# Patient Record
Sex: Female | Born: 1960 | Race: White | Hispanic: No | Smoking: Never smoker
Health system: Southern US, Community
[De-identification: ages and names within clinical notes are randomized; demographics above are authoritative.]

## PROBLEM LIST (undated history)

## (undated) DIAGNOSIS — S134XXA Sprain of ligaments of cervical spine, initial encounter: Secondary | ICD-10-CM

---

## 1998-03-21 ENCOUNTER — Other Ambulatory Visit: Admission: RE | Admit: 1998-03-21 | Discharge: 1998-03-21 | Payer: Self-pay | Admitting: Obstetrics and Gynecology

## 1998-06-06 ENCOUNTER — Other Ambulatory Visit: Admission: RE | Admit: 1998-06-06 | Discharge: 1998-06-06 | Payer: Self-pay | Admitting: Obstetrics and Gynecology

## 1998-08-15 ENCOUNTER — Ambulatory Visit (HOSPITAL_BASED_OUTPATIENT_CLINIC_OR_DEPARTMENT_OTHER): Admission: RE | Admit: 1998-08-15 | Discharge: 1998-08-15 | Payer: Self-pay

## 1998-11-12 ENCOUNTER — Other Ambulatory Visit: Admission: RE | Admit: 1998-11-12 | Discharge: 1998-11-12 | Payer: Self-pay | Admitting: Obstetrics and Gynecology

## 1999-11-05 ENCOUNTER — Ambulatory Visit (HOSPITAL_COMMUNITY): Admission: RE | Admit: 1999-11-05 | Discharge: 1999-11-05 | Payer: Self-pay | Admitting: Pulmonary Disease

## 1999-11-05 ENCOUNTER — Encounter: Payer: Self-pay | Admitting: Pulmonary Disease

## 1999-12-01 ENCOUNTER — Ambulatory Visit (HOSPITAL_COMMUNITY): Admission: RE | Admit: 1999-12-01 | Discharge: 1999-12-01 | Payer: Self-pay | Admitting: Critical Care Medicine

## 1999-12-01 ENCOUNTER — Encounter: Payer: Self-pay | Admitting: Critical Care Medicine

## 2000-03-10 ENCOUNTER — Encounter: Payer: Self-pay | Admitting: Critical Care Medicine

## 2000-03-10 ENCOUNTER — Ambulatory Visit (HOSPITAL_COMMUNITY): Admission: RE | Admit: 2000-03-10 | Discharge: 2000-03-10 | Payer: Self-pay | Admitting: Critical Care Medicine

## 2000-03-24 ENCOUNTER — Inpatient Hospital Stay (HOSPITAL_COMMUNITY): Admission: EM | Admit: 2000-03-24 | Discharge: 2000-03-27 | Payer: Self-pay | Admitting: Critical Care Medicine

## 2000-03-24 ENCOUNTER — Encounter: Payer: Self-pay | Admitting: Critical Care Medicine

## 2000-03-25 ENCOUNTER — Encounter: Payer: Self-pay | Admitting: Critical Care Medicine

## 2000-03-26 ENCOUNTER — Encounter: Payer: Self-pay | Admitting: Critical Care Medicine

## 2000-03-27 ENCOUNTER — Encounter: Payer: Self-pay | Admitting: Critical Care Medicine

## 2000-06-07 ENCOUNTER — Ambulatory Visit: Admission: RE | Admit: 2000-06-07 | Discharge: 2000-06-07 | Payer: Self-pay | Admitting: Critical Care Medicine

## 2000-06-07 ENCOUNTER — Encounter: Payer: Self-pay | Admitting: Critical Care Medicine

## 2008-09-29 ENCOUNTER — Inpatient Hospital Stay (HOSPITAL_COMMUNITY): Admission: EM | Admit: 2008-09-29 | Discharge: 2008-10-05 | Payer: Self-pay | Admitting: Emergency Medicine

## 2008-10-04 ENCOUNTER — Encounter: Payer: Self-pay | Admitting: Obstetrics & Gynecology

## 2010-02-11 ENCOUNTER — Encounter: Payer: Self-pay | Admitting: Gastroenterology

## 2010-05-11 IMAGING — CT CT ABDOMEN W/ CM
2 of 5 series · 16 of 46 positions shown, 18 images · IV contrast (APPLIED)
Comparison: Plain film chest earlier today.

CT ABDOMEN

Addendum Begins

Further clinical information now brought to my attention.  The
patient has no risk factors for hydrosalpinx or pelvic inflammatory
disease.  Therefore, the cylindrical fluid collection in the left
hemi pelvis could represent an abscess from the adjacent sigmoid
colon.  However, the adjacent colon is not significantly inflamed.
I discussed this case with the scarring service.  The patient will
receive a pelvic ultrasound for further evaluation.  Depending on
the outcome of this study, aspiration may be necessary to evaluate
for possible diverticular abscess.
Addendum Ends
CLINICAL DATA: Abdominal pain.  Nausea.
CT ABDOMEN AND PELVIS WITH CONTRAST
TECHNIQUE: Multidetector CT imaging of the abdomen and pelvis was
performed following the standard protocol following the bolus
administration of intravenous contrast.
Contrast: 100 ml [id number]

[Series 2: abd_pel 5.0 b40f st · axial · 0.68mm/px · z∈[-494,-78]mm · 13 of 93 slices shown, 15 images]
[im 5/93  soft-tissue]
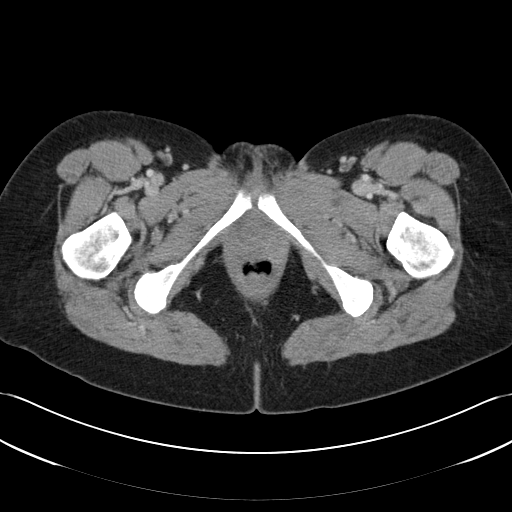
[im 5/93  bone]
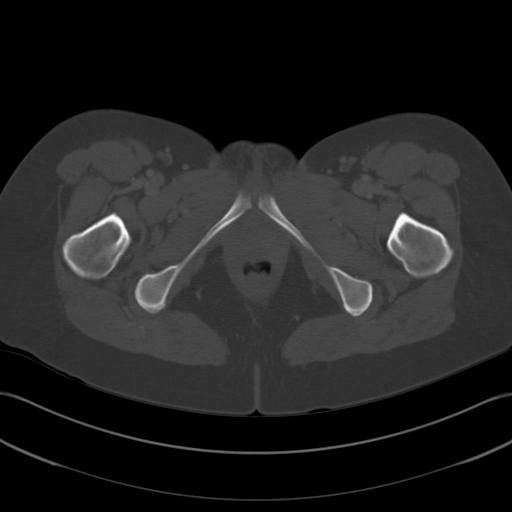
[im 15/93  soft-tissue]
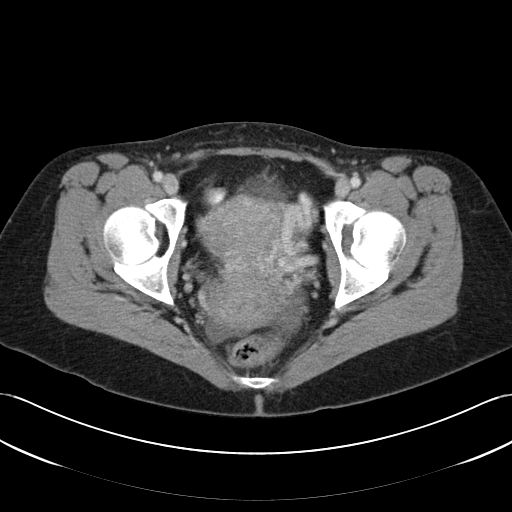
[im 20/93  soft-tissue]
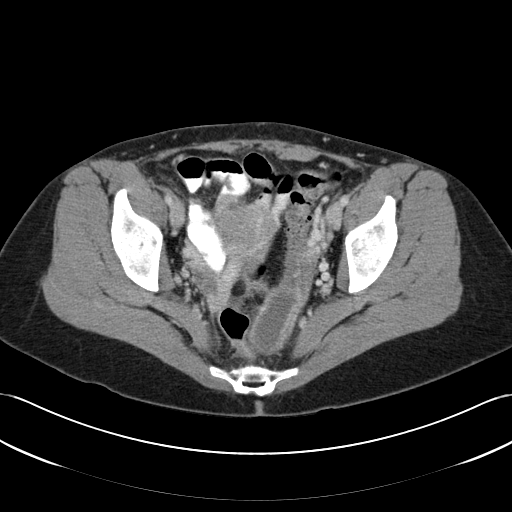
[im 25/93  soft-tissue]
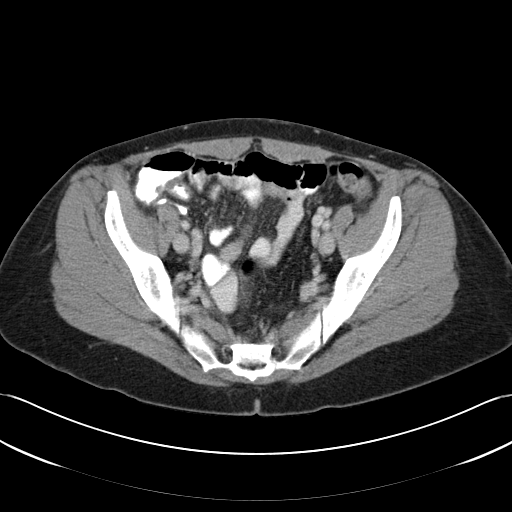
[im 34/93  soft-tissue]
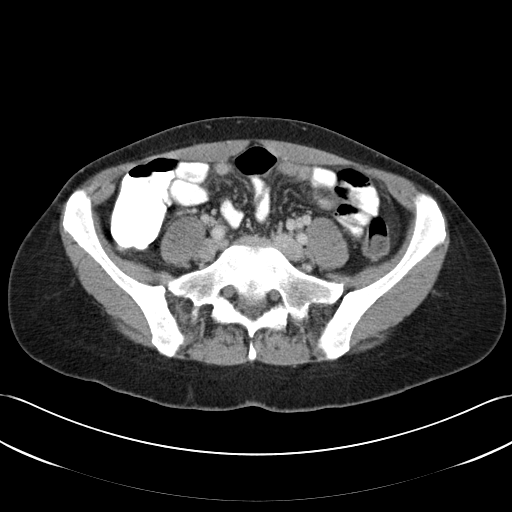
[im 39/93  soft-tissue]
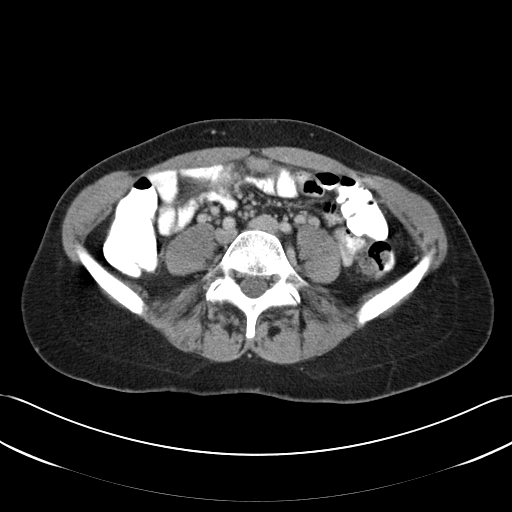
[im 49/93  soft-tissue]
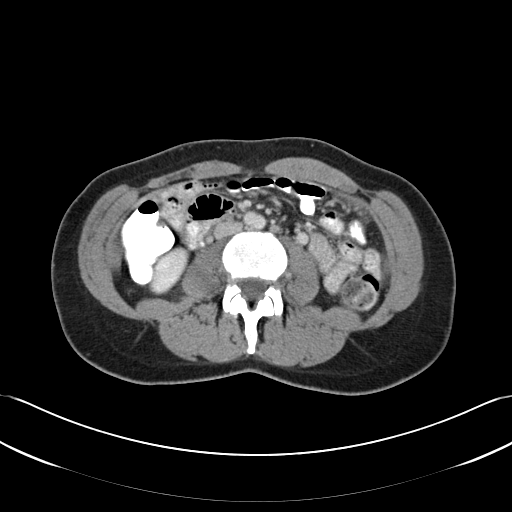
[im 54/93  soft-tissue]
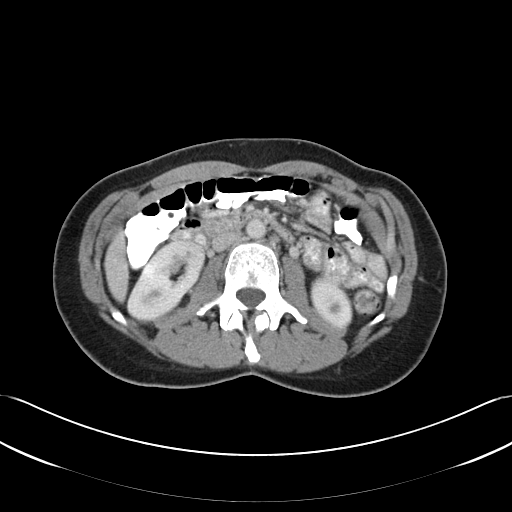
[im 59/93  soft-tissue]
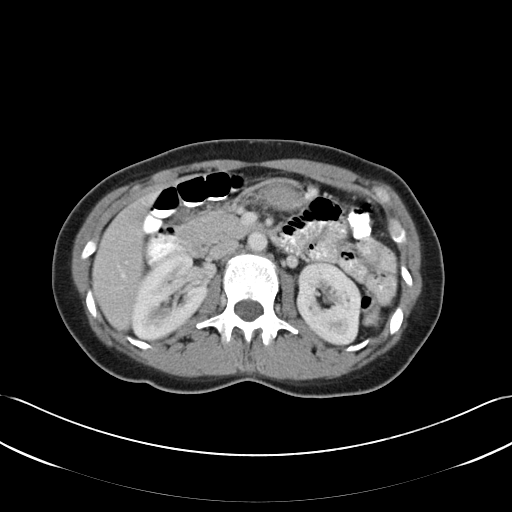
[im 59/93  bone]
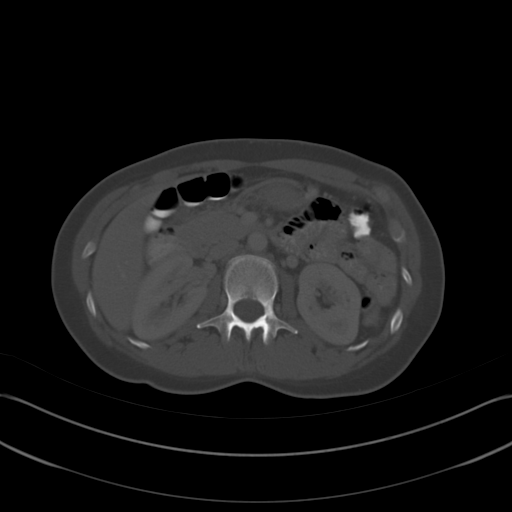
[im 68/93  soft-tissue]
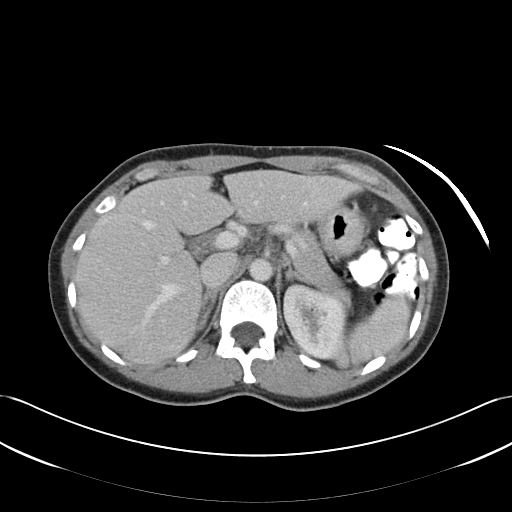
[im 73/93  soft-tissue]
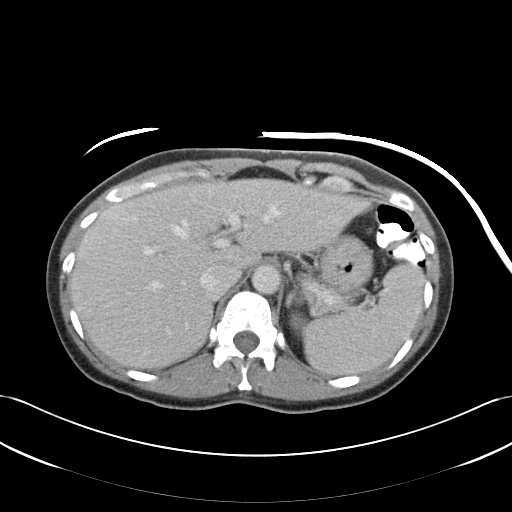
[im 78/93  soft-tissue]
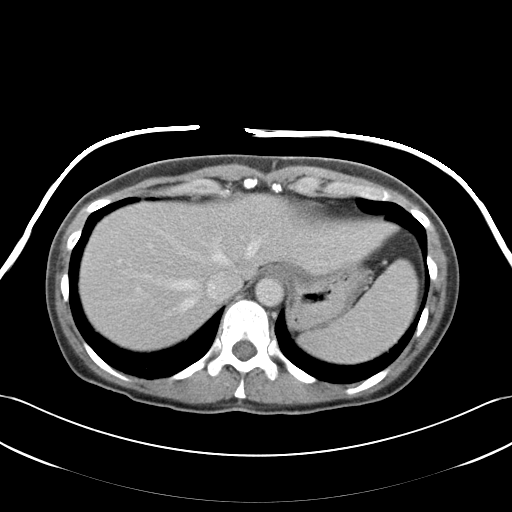
[im 88/93  soft-tissue]
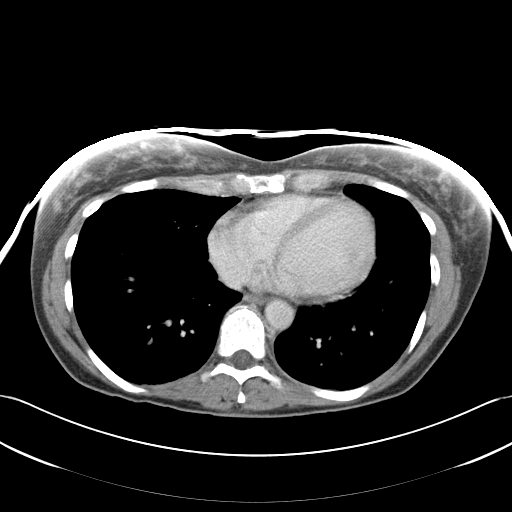

[Series 602: coronal · coronal · 0.94mm/px · 3 of 55 slices shown]
[im 19/55  soft-tissue]
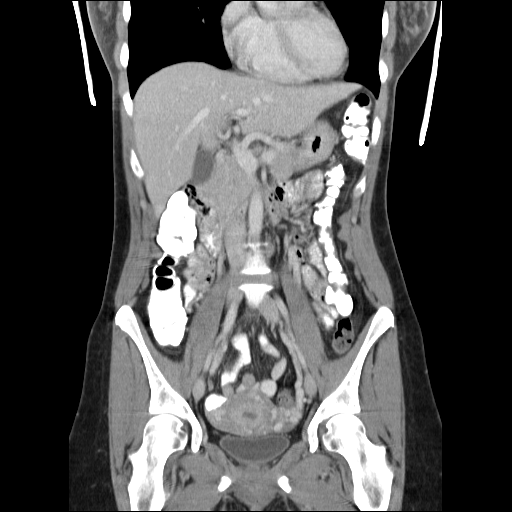
[im 25/55  soft-tissue]
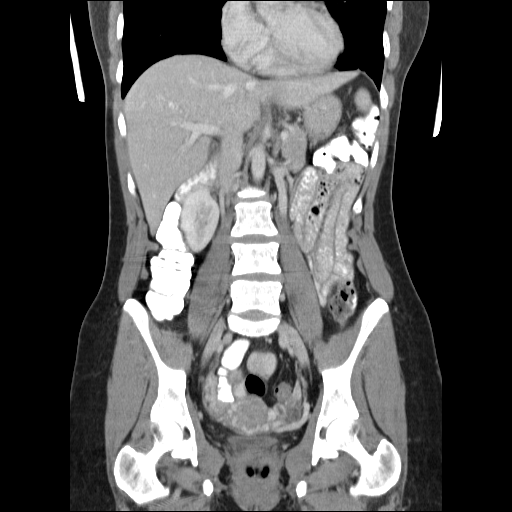
[im 31/55  soft-tissue]
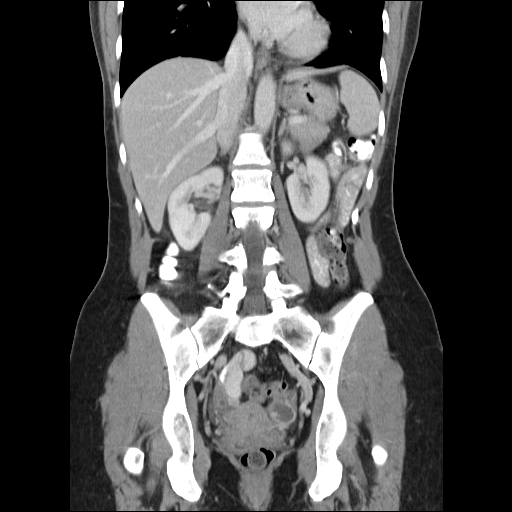

[16 of 46 positions shown; findings below may reference images not displayed]

FINDINGS: Mild bibasilar atelectasis. Normal heart size without
pericardial or pleural effusion.  Normal liver, spleen, pancreas.
The stomach appears thick-walled.  This is likely due to
underdistension.

Gallbladder phrygian cap.  No acute cholecystitis or biliary ductal
dilatation.

Normal adrenal glands and right kidney.  Lower pole left renal
cyst.

Prominent left greater than right gonadal veins as can be seen with
pelvic congestion syndrome. No retroperitoneal or retrocrural
adenopathy. Normal colon, appendix, and terminal ileum.

Normal abdominal small bowel without ascites.
IMPRESSION: 1. No acute abdominal process.
2.  Apparent gastric wall thickening felt to be due to
underdistension.  Gastritis felt less likely.

CT PELVIS
FINDINGS: Colonic diverticulosis.  Normal pelvic small bowel.  No
pelvic adenopathy.    Normal urinary bladder and uterus. Trace free
pelvic fluid is likely physiologic.  Normal right adnexa.  A
cylindrical fluid density structure with peripheral
hyperenhancement measures 6.8 x 2.8 cm on image 76. No acute
osseous abnormality.
IMPRESSION: 1.  A cylindrical fluid density structure in the left hemi pelvis
is suspicious for hydrosalpinx.  No specific signs to suggest
pyosalpinx.  Correlate with signs/symptoms of pelvic inflammatory
disease.

2.  Prominent gonadal veins as can be seen with pelvic congestion
syndrome.
3. Trace free pelvic fluid is likely physiologic.

## 2010-05-11 IMAGING — CR DG CHEST 1V PORT
1 series · 1 of 1 positions shown · non-contrast
Comparison: None

CLINICAL DATA: Abdominal pain

CHEST - 1 VIEW

[view not recorded]
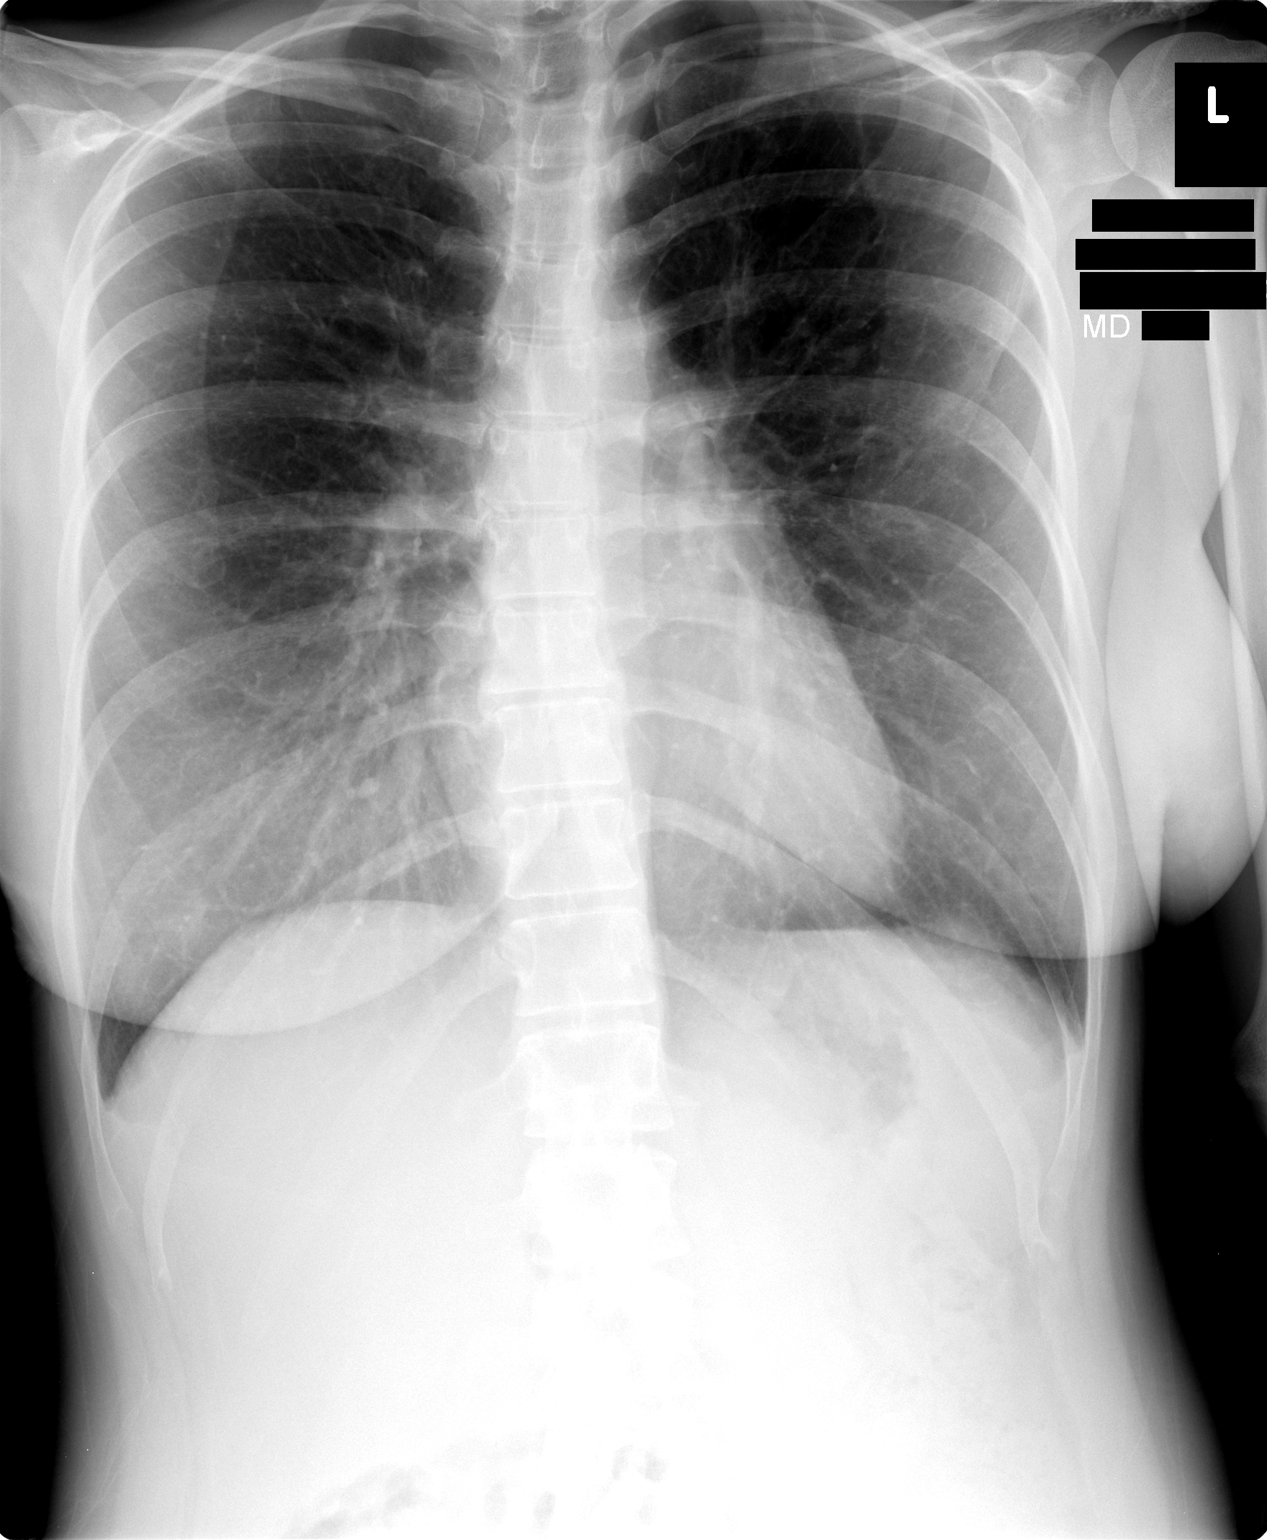

[1 of 1 positions shown; findings below may reference images not displayed]

FINDINGS: The heart size and mediastinal contours are within normal
limits.  Both lungs are clear.
IMPRESSION: No active disease.

## 2010-05-14 IMAGING — US US TRANSVAGINAL NON-OB
1 series · 13 of 25 positions shown · non-contrast
Comparison: At abdomen pelvis CTs 09/29/2008 and pelvic ultrasound
09/29/2008.

CLINICAL DATA: Left lower quadrant/left pelvic pain. Follow-up
abnormalities seen in left adnexa.

TRANSVAGINAL ULTRASOUND OF PELVIS
TECHNIQUE: Transvaginal ultrasound examination of the pelvis was
performed including evaluation of the uterus, ovaries, adnexal
regions, and pelvic cul-de-sac.

[Series 1: us transvaginal non-ob · 0.13mm/px · 13 of 55 slices shown]
[im 1/55]
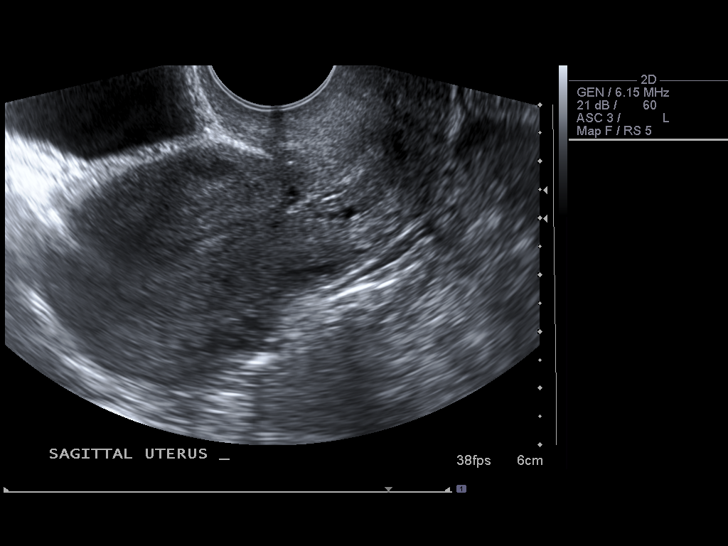
[im 5/55]
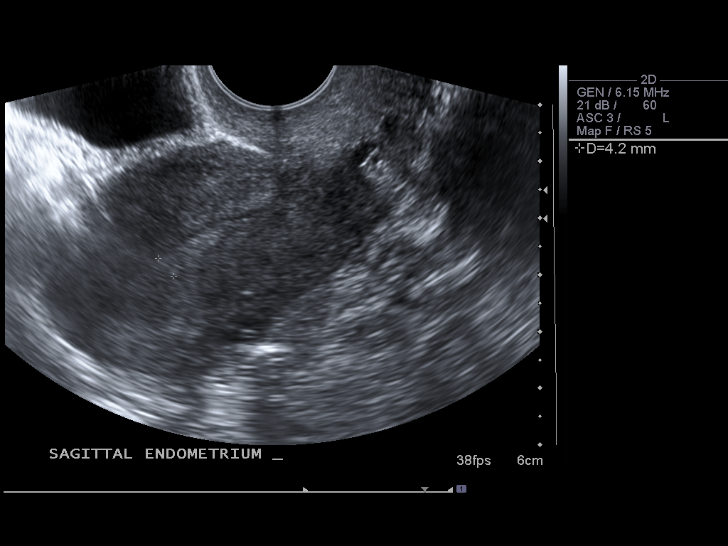
[im 10/55]
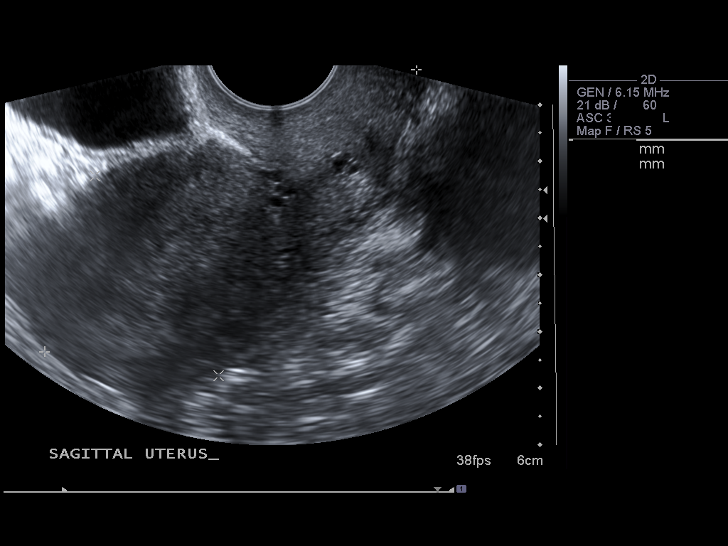
[im 14/55]
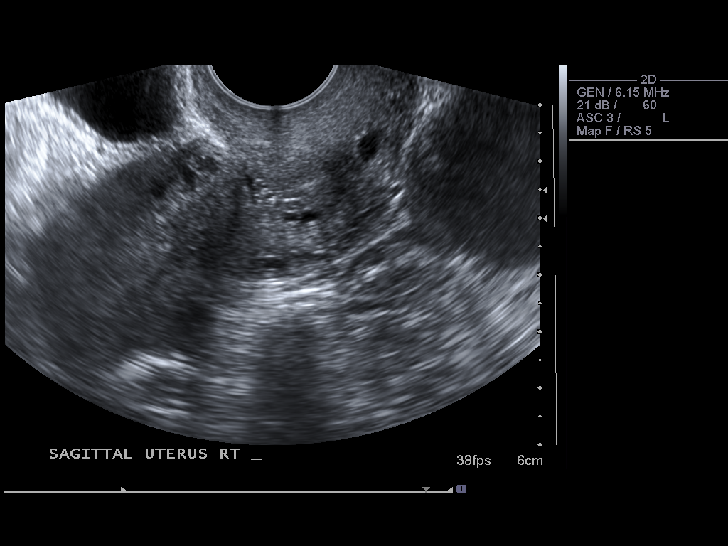
[im 19/55]
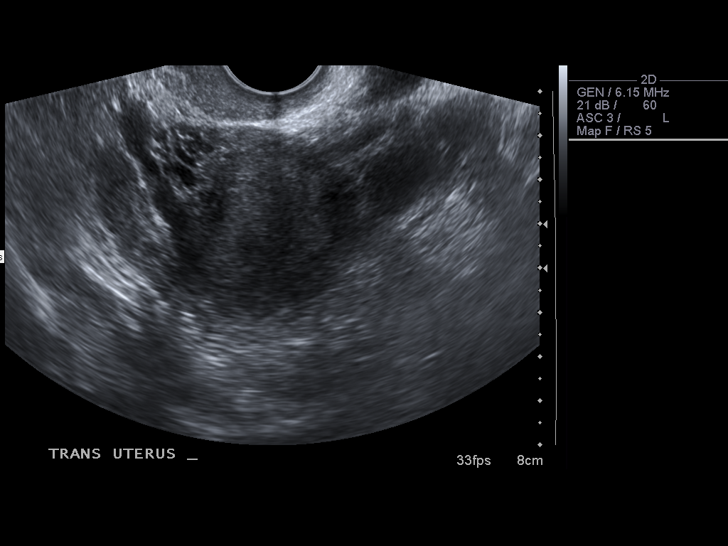
[im 23/55]
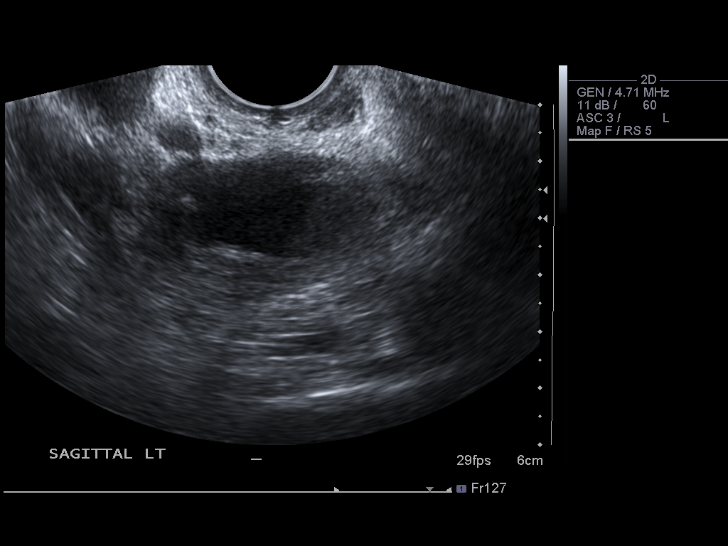
[im 28/55]
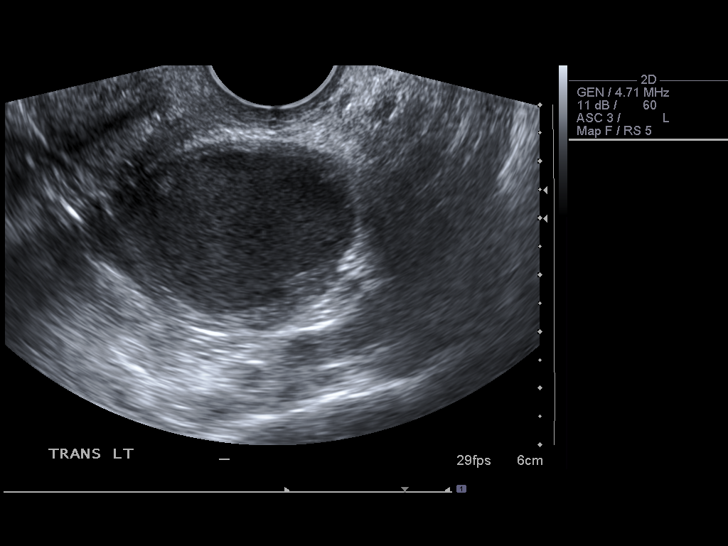
[im 32/55]
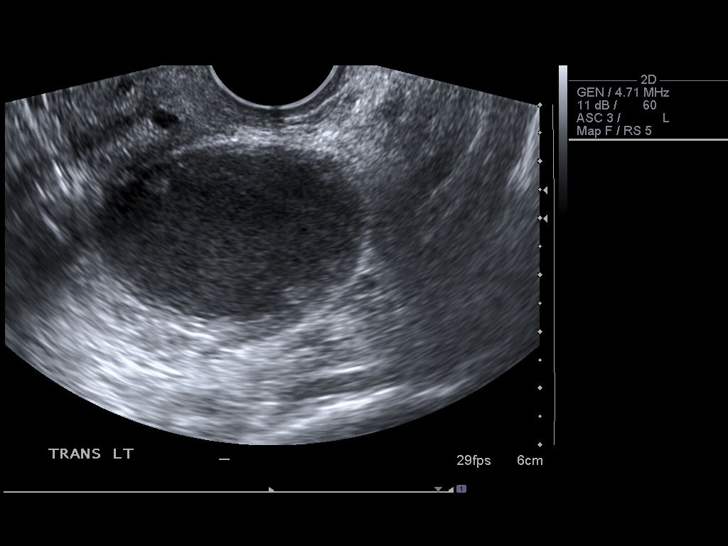
[im 37/55]
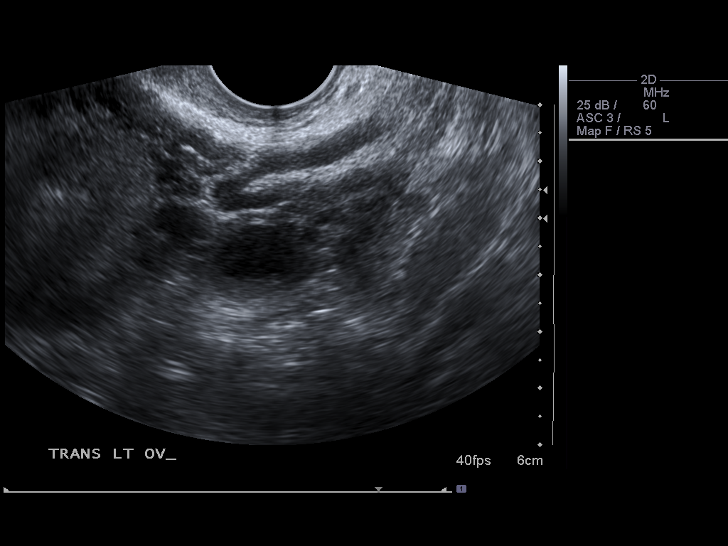
[im 41/55]
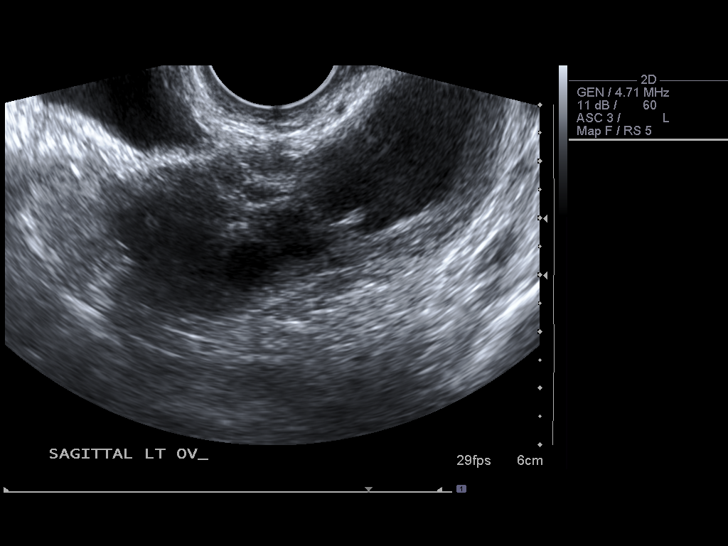
[im 46/55]
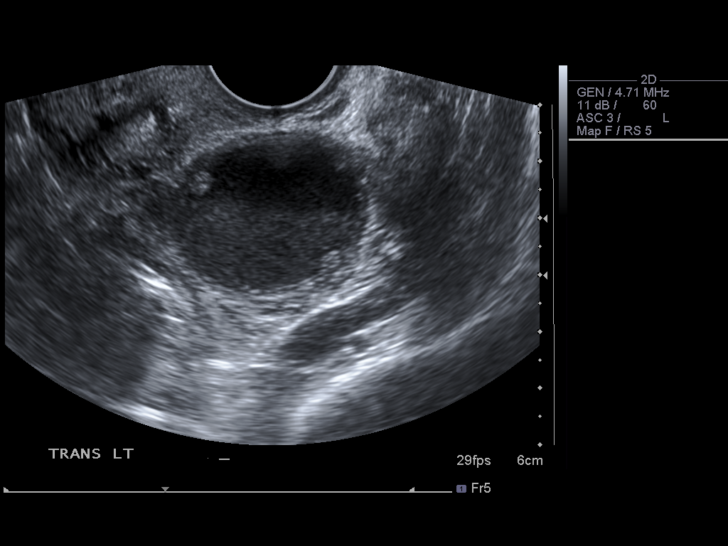
[im 50/55]
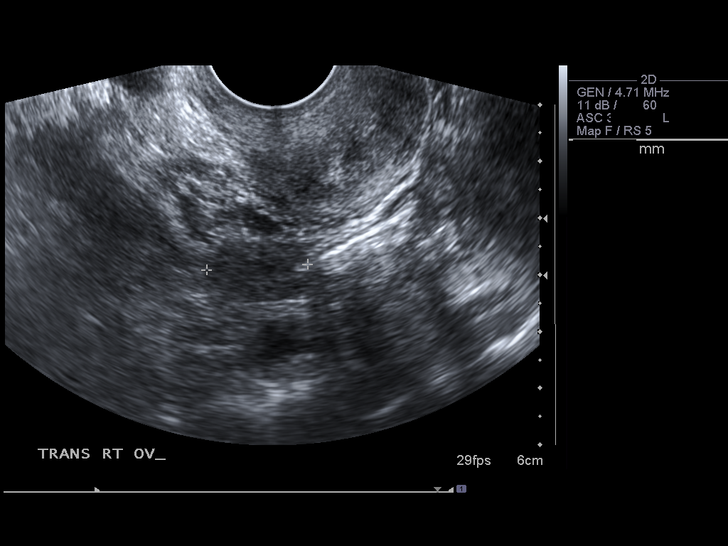
[im 55/55]
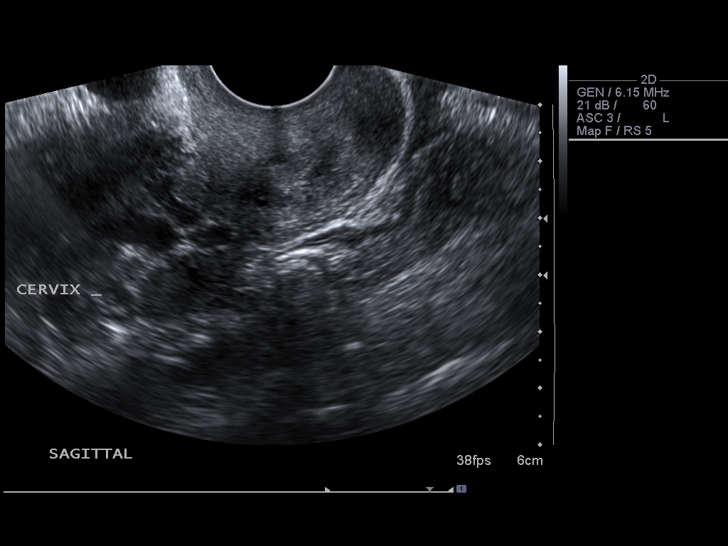

[13 of 25 positions shown; findings below may reference images not displayed]

FINDINGS: Uterus:  The uterus is 8.0 cm in length by 4.0 cm AP diameter by
4.1 cm in width. The endometrium is 4 mm in thickness.

In the left adnexa is a dilated tubular structure which on today's
examination demonstrates a fluid/ debris level internally.  There
are several focal echogenic areas of nodular thickening along the
wall of this tubular structure, which have appearances suggestive
of thickened endosalpingeal folds of a fallopian tube.  This
tubular structure measures approximately 7.6 x 3.1 x 4.8 cm.  Some
vascular flow is identified within the wall of the tubular
structure, but none is seen internally.  The adjacent left ovary is
estimated to measure 2.9 x 1.4 x 1.8 cm and does contain some
internal anechoic areas which may be follicles.

The right ovary measures 2.6 x 1.3 x 1.8 cm and has  normal
sonographic appearances.

No discrete free pelvic fluid is identified.
IMPRESSION: Persistent tubular cystic mass in the left adnexa measures 7.6 cm
greatest diameter.  Given the presence of an internal fluid/debris
level and nodular echogenic foci along the wall (suggestive of
thickened endosalpingeal folds), the sonographic appearances are
most suggestive of pyosalpinx related to pelvic inflammatory
disease. A pelvic abscess secondary to other causes such as
diverticular disease is a consideration, but felt to be less
likely.  Ovarian neoplasm cannot be entirely excluded and follow-up
imaging to ensure resolution is recommended.

## 2010-05-20 NOTE — Letter (Signed)
Summary: Colonoscopy Date Change Letter  [facility]  [address] [place], [place] [address]   Phone: [phone]  Fax: [phone]      February 11, 2010 MRN: [id number]   [facility] [address] [place], [place]  [address]   Dear Ms. Rissmiller,   Previously you were recommended to have a repeat colonoscopy around this time. Your chart was recently reviewed by Dr.Tadarrius Burch of [facility]. Follow up colonoscopy is now recommended in 02-2013. This revised recommendation is based on current, nationally recognized guidelines for colorectal cancer screening and polyp surveillance. These guidelines are endorsed by the American Cancer Society, [employer] on Colorectal Cancer as well as numerous other major medical organizations.  Please understand that our recommendation assumes that you do not have any new symptoms such as bleeding, a change in bowel habits, anemia, or significant abdominal discomfort. If you do have any concerning GI symptoms or want to discuss the guideline recommendations, please call to arrange an office visit at your earliest convenience. Otherwise we will keep you in our reminder system and contact you 1-2 months prior to the date listed above to schedule your next colonoscopy.  Thank you,  Barbette Hair. Arlyce Dice, M.D.  [facility] [phone]

## 2010-07-28 LAB — DIFFERENTIAL
Basophils Absolute: 0 10*3/uL (ref 0.0–0.1)
Basophils Absolute: 0.2 10*3/uL — ABNORMAL HIGH (ref 0.0–0.1)
Basophils Relative: 1 % (ref 0–1)
Basophils Relative: 1 % (ref 0–1)
Basophils Relative: 0 % (ref 0–1)
Basophils Relative: 0 % (ref 0–1)
Basophils Relative: 1 % (ref 0–1)
Basophils Relative: 1 % (ref 0–1)
Eosinophils Absolute: 0.1 10*3/uL (ref 0.0–0.7)
Eosinophils Absolute: 0.2 10*3/uL (ref 0.0–0.7)
Eosinophils Absolute: 0.1 10*3/uL (ref 0.0–0.7)
Eosinophils Absolute: 0.2 10*3/uL (ref 0.0–0.7)
Eosinophils Absolute: 0.2 10*3/uL (ref 0.0–0.7)
Eosinophils Relative: 2 % (ref 0–5)
Eosinophils Relative: 4 % (ref 0–5)
Eosinophils Relative: 2 % (ref 0–5)
Lymphocytes Relative: 12 % (ref 12–46)
Lymphs Abs: 1.1 10*3/uL (ref 0.7–4.0)
Lymphs Abs: 1.1 10*3/uL (ref 0.7–4.0)
Monocytes Absolute: 0.8 10*3/uL (ref 0.1–1.0)
Monocytes Relative: 13 % — ABNORMAL HIGH (ref 3–12)
Monocytes Relative: 16 % — ABNORMAL HIGH (ref 3–12)
Monocytes Relative: 10 % (ref 3–12)
Monocytes Relative: 10 % (ref 3–12)
Monocytes Relative: 4 % (ref 3–12)
Monocytes Relative: 4 % (ref 3–12)
Neutro Abs: 10.2 10*3/uL — ABNORMAL HIGH (ref 1.7–7.7)
Neutro Abs: 5.8 10*3/uL (ref 1.7–7.7)
Neutrophils Relative %: 74 % (ref 43–77)
Neutrophils Relative %: 75 % (ref 43–77)
Neutrophils Relative %: 86 % — ABNORMAL HIGH (ref 43–77)

## 2010-07-28 LAB — CBC
HCT: 31.3 % — ABNORMAL LOW (ref 36.0–46.0)
HCT: 37 % (ref 36.0–46.0)
Hemoglobin: 12.3 g/dL (ref 12.0–15.0)
MCHC: 33.8 g/dL (ref 30.0–36.0)
MCHC: 34.1 g/dL (ref 30.0–36.0)
MCHC: 32.9 g/dL (ref 30.0–36.0)
MCHC: 34.9 g/dL (ref 30.0–36.0)
MCHC: 35.3 g/dL (ref 30.0–36.0)
MCV: 94 fL (ref 78.0–100.0)
MCV: 94.2 fL (ref 78.0–100.0)
MCV: 94.9 fL (ref 78.0–100.0)
MCV: 95 fL (ref 78.0–100.0)
MCV: 95.2 fL (ref 78.0–100.0)
Platelets: 194 10*3/uL (ref 150–400)
Platelets: 159 10*3/uL (ref 150–400)
Platelets: 160 10*3/uL (ref 150–400)
Platelets: 195 10*3/uL (ref 150–400)
RBC: 3.33 MIL/uL — ABNORMAL LOW (ref 3.87–5.11)
RBC: 3.94 MIL/uL (ref 3.87–5.11)
RBC: 3.59 MIL/uL — ABNORMAL LOW (ref 3.87–5.11)
RBC: 3.72 MIL/uL — ABNORMAL LOW (ref 3.87–5.11)
RBC: 3.83 MIL/uL — ABNORMAL LOW (ref 3.87–5.11)
RDW: 12.3 % (ref 11.5–15.5)
RDW: 12.6 % (ref 11.5–15.5)
WBC: 6.6 10*3/uL (ref 4.0–10.5)
WBC: 7.9 10*3/uL (ref 4.0–10.5)

## 2010-07-28 LAB — CLOSTRIDIUM DIFFICILE EIA

## 2010-07-28 LAB — HEPATIC FUNCTION PANEL
ALT: 16 U/L (ref 0–35)
AST: 23 U/L (ref 0–37)
Bilirubin, Direct: 0.2 mg/dL (ref 0.0–0.3)
Total Bilirubin: 1.1 mg/dL (ref 0.3–1.2)

## 2010-07-28 LAB — CULTURE, BLOOD (ROUTINE X 2): Culture: NO GROWTH

## 2010-07-28 LAB — APTT: aPTT: 33 seconds (ref 24–37)

## 2010-07-28 LAB — BASIC METABOLIC PANEL
BUN: 5 mg/dL — ABNORMAL LOW (ref 6–23)
CO2: 27 mEq/L (ref 19–32)
Chloride: 104 mEq/L (ref 96–112)
Creatinine, Ser: 0.64 mg/dL (ref 0.4–1.2)
Potassium: 3.9 mEq/L (ref 3.5–5.1)

## 2010-07-28 LAB — POCT I-STAT, CHEM 8
BUN: 4 mg/dL — ABNORMAL LOW (ref 6–23)
Calcium, Ion: 1.13 mmol/L (ref 1.12–1.32)
Chloride: 100 meq/L (ref 96–112)
Creatinine, Ser: 0.8 mg/dL (ref 0.4–1.2)
Glucose, Bld: 94 mg/dL (ref 70–99)
HCT: 46 % (ref 36.0–46.0)
Hemoglobin: 15.6 g/dL — ABNORMAL HIGH (ref 12.0–15.0)
Potassium: 3.9 mEq/L (ref 3.5–5.1)
Sodium: 137 meq/L (ref 135–145)
TCO2: 26 mmol/L (ref 0–100)

## 2010-07-28 LAB — URINALYSIS, ROUTINE W REFLEX MICROSCOPIC
Hgb urine dipstick: NEGATIVE
Nitrite: NEGATIVE
Protein, ur: NEGATIVE mg/dL
Urobilinogen, UA: 0.2 mg/dL (ref 0.0–1.0)

## 2010-07-28 LAB — URINE MICROSCOPIC-ADD ON

## 2010-07-28 LAB — PROTIME-INR
INR: 1.1 (ref 0.00–1.49)
Prothrombin Time: 14.6 seconds (ref 11.6–15.2)

## 2010-07-28 LAB — GC/CHLAMYDIA PROBE AMP, GENITAL: Chlamydia, DNA Probe: NEGATIVE

## 2010-07-28 LAB — HCG, SERUM, QUALITATIVE: Preg, Serum: NEGATIVE

## 2010-09-02 NOTE — Consult Note (Signed)
NAMEOLEDA, BORSKI                ACCOUNT NO.:  [id number]   MEDICAL RECORD NO.:  [id number]          PATIENT TYPE:  INP   LOCATION:  1527                         FACILITY:  [facility]   PHYSICIAN:  Wilmon Arms. Corliss Skains, M.D. DATE OF BIRTH:  October 14, 1960   DATE OF CONSULTATION:  10/01/2008  DATE OF DISCHARGE:                                 CONSULTATION   CONSULTING PHYSICIAN:  M. Leda Quail, MD.   REASON FOR EVALUATION:  Fever, possible diverticular abscess.   This is a 50 year old female in good health who presents with a 3-day  history of acute onset of lower abdominal pelvic pain.  The patient has  had previous ovarian cysts which have ruptured and she states that this  pain feels similar although it did not resolve as it usually does.  She  came into the emergency department on June 12 where she underwent a  workup.  At that time her white count was noted to be elevated at 13.6,  hemoglobin 14.8.  Electrolytes within normal limits.  A CT scan was  obtained which showed no abdominal findings but in the pelvis she did  have a cylindrical fluid density structure in the left hemipelvis  suspicious for hydrosalpinx.  Dr. Hyacinth Meeker admitted the patient.  She  really has no risk factors for pelvic inflammatory disease.  She had  been on antibiotics and her pain has improved.  Her white count has  normalized but she continues to have intermittent fever.  We are asked  to see her for possible diverticulitis.  The patient has known  diverticulosis with a colonoscopy within the last 5 years by Dr. Victorino Dike.  No other findings were noted.  The patient does report that  she continues to have bowel movements and that her pain tends to worsen  when she has a bowel movement.  On the CT scan there was no sign of  colon wall thickening or surrounding inflammation.   PAST MEDICAL HISTORY:  1. Diverticulosis.  2. Skin cancer.  3. Ovarian cysts.   PAST SURGICAL HISTORY:  1. Cesarean section  2.  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  3. Sinus surgery.   MEDICATIONS:  No medications at home.  In the hospital she has been on  cefoxitin, Flagyl and doxycycline.   ALLERGIES:  NONE, ALTHOUGH SHE IS VERY SENSITIVE TO NARCOTICS.   SOCIAL HISTORY:  Nonsmoker, nondrinker.  The patient is widowed.   PHYSICAL EXAMINATION:  T-max 1016, T current 98.2, pulse 78, blood  pressure 114/71, saturations 99% on room air.  This is a well-developed,  well-nourished female in no apparent distress.  HEENT:  EOMI.  Sclerae anicteric.  NECK:  No mass, no thyromegaly.  LUNGS:  Clear.  Normal respiratory effort.  HEART:  Regular rate, rhythm.  No murmur.  ABDOMEN:  Positive bowel sounds, mildly distended, minimal generalized  tenderness but most of her tenderness is located in the left lower  quadrant.  EXTREMITIES:  No edema.  SKIN:  Warm, dry with no sign of jaundice.   LABORATORIES:  White  count 9.9, hemoglobin 12.1 CT scan as described  above.   IMPRESSION:  Abdominal pain, intermittent fever with a tubular fluid-  filled structure in the left hemipelvis.  Differential diagnosis of  hydrosalpinx versus diverticular abscess.  I am not convinced that this  represents diverticulitis as she has no thickening or inflammation of  the colon.  However, this case is a bit unusual as she has very little  risk factors for pelvic inflammatory disease.   RECOMMENDATIONS:  If the patient does not continue to improve on the  antibiotics, we would ask Radiology to repeat her CT scan and aspirate  this fluid collection.  If they obtain purulent fluid then I would ask  them to leave a drain in place.  If it is hydrosalpinx then  aspirating  it may relieve her symptoms.  I discussed this plan with Dr. Hyacinth Meeker by  telephone.      Wilmon Arms. Tsuei, M.D.  Electronically Signed     MKT/MEDQ  D:  10/01/2008  T:  10/01/2008  Job:  [id number]   cc:   M. Leda Quail, MD  Fax:  [phone]

## 2010-09-02 NOTE — Consult Note (Signed)
Megan Bauer, COVEY                ACCOUNT NO.:  [id number]   MEDICAL RECORD NO.:  [id number]          PATIENT TYPE:  EMS   LOCATION:  ED                           FACILITY:  [facility]   PHYSICIAN:  M. Leda Quail, MD  DATE OF BIRTH:  11/09/1960   DATE OF CONSULTATION:  09/30/2008  DATE OF DISCHARGE:                                 CONSULTATION   CHIEF COMPLAINT:  Abdominal pain.   HISTORY OF PRESENT ILLNESS:  The patient is a 50 year old, widowed, G2,  P1, A1 white female who presents with acute onset of abdominal pain at  approximately 11:00 yesterday. It was initially low in the pelvis and  bilateral.  It lasted for approximately an hour and a half before she  had some lessening of symptoms. She has a history of ovarian cyst and  has had rupture in the past, and she felt this was more consistent with  a ruptured ovarian cyst.  Generally, when this happens the pain slowly  subsides, and this did not occur. The pain continued on through the  night and into this morning, and at 11:30 today her mother decided to  encourage the patient to come in. Ms. Megan Bauer lives in [place]. She is  from the [place] area, and she is here visiting her mom.  Her  daughter is in college here, so she travels back and forth between  [place] and [place] quite frequently.  She reports that pain has  waves of being quite severe, up to an 8/10.  She has had a bowel  movement in the last 24 hours and she did have worsening of pain with  that.  She has had some waves of nausea in association with the pain.  The pain has now spread throughout the abdomen, and she is actually  feeling it in her upper abdomen as well. It is not particularly  localized on exam. She has no dysuria. She has had some chills.  She has  had no vomiting.  The patient does report that she has been on  amoxicillin 875 mg  twice daily for 10 days which she finished about a  week ago. This was started because of possible fluid collection  or  abscess in her face underneath the area where neurosurgery was completed  over 1-1/2 years ago. The patient's additional history is significant  for diverticular disease as denoted on colonoscopy.  She cannot remember  her last colonoscopy, but she did do it some time in her mid 22's.  Dr.  Terrial Rhodes did this. I have looked through the hospital electronic  records, and there is no documentation of this in the system at this  time.  She has not taken anything to alleviate the pain.  She felt like  this was not the correct thing to do.  Nothing really has made the pain  better after lying flat or on her side with her legs bent.   PAST MEDICAL HISTORY:  Significant for diverticular disease and squamous  cell carcinoma of the face as well as ovarian cyst.   FAMILY HISTORY:  Father  died of bile duct cancer several years ago.  She  has no breast, ovarian, uterine, or colon cancer in her family.   SOCIAL HISTORY:  Negative x3.  She is widowed. She lives in [place].  Her husband died after a plane crash about 10 years ago.   ALLERGIES:  She is sensitive to NARCOTICS.   PAST SURGICAL HISTORY:  Cesarean section for the birth of her child who  was a breach presentation.  Nose surgery and then plastic surgery with  skin graft on her left face below her eye, and sinus surgery in 1998.   GYN HISTORY:  She is not sexually active and has not been so since the  death of her husband. Cycles are monthly, they last about 4 days.  Her  LMP was September 18, 2008. She does report a normal Pap and mammogram within  the past year. Those were done in [place] where she lives.   REVIEW OF SYSTEMS:  She has some mild headache today. No shortness of  breath. No  palpitations.  Nausea as per above. No diarrhea.  No  constipation.  No emesis.  She has no back pain or flank pain.  She has  no difficulty in movement. No trouble walking.  She has no swelling in  her lower extremities.  She has had some chills  as above. No increased  thirst.  She has actually decreased appetite today and has worsening  abdominal discomfort with eating. She has noted some vaginal discharge.  She has no irregular bleeding.  She does tend to have some bleeding with  ruptured cyst but has not noted any of this. No vaginal odor. No dysuria  or hematuria.   PHYSICAL EXAMINATION:  VITAL SIGNS:  Temperature 101, pulse 86,  respirations 16, blood pressure 122/71.  GENERAL: A well-developed, well-nourished white female in no acute  distress. She does appear uncomfortable.  NECK: Supple. Thyroid is not enlarged, nontender.  Trachea is midline.  No lymphadenopathy.  HEENT: Normocephalic, atraumatic. Pupils are equally reactive,  responsive to light.  LUNGS: Clear to auscultation bilaterally with good respiratory effort.  CARDIOVASCULAR:  Regular rate and rhythm without murmurs, rubs, or  gallops.  MUSCULOSKELETAL:  Flanks:  No CVA tenderness.  BREASTS:  No masses. No lymphadenopathy. No nipple discharge.  ABDOMEN: Soft, nondistended. Positive bowel sounds. She is tender to  palpation of the lower quadrants in particular.  She does have some  guarding but no rebound. She does not have a surgical or acute abdomen  at this moment.  PELVIC:  Normal-appearing external female genitalia.  Ureteral meatus  bilaterally normal and nontender to palpation.  She has had some  clearish, yellowish discharge vaginally. There is no odor.  She has a  parous cervix. There are no vaginal lesions.  Anteflexed uterus is  smooth and nontender to palpation.  No adnexal masses. There is some  questionable fullness in the left adnexa.  She has positive cervical  motion tenderness.  She has no other hemorrhoids or rectal lesions.  SKIN: Without lesions.  She does have a scar on her left face consistent  with skin grafting.  NEUROLOGICAL:  Cranial nerves II through XII are grossly intact.  She is  alert and oriented x3.   LABS AND X-RAYS:  CT  shows a 6.8x2.7 possible hydrosalpinx at the left  lower quadrant, possible diverticular disease along the colon.  GC and  Chlamydia are pending.  X-rays are normal.  AST and ALT are 23 and  16  respectively.  UA is negative.  Wet smear is pending. Electrolytes:  Sodium 137, potassium 3.9, BUN and creatinine 4 and 0.9 respectively,  glucose 9.4, white count 13.6 with 86% neutrophils, hemoglobin 14.8,  platelets 15.  Ultrasound shows a cylindrical 7x3.1 cm area that appears  separate from the ovary. The right and left ovaries appear normal.  There is good flow to the ovaries.  There is no evidence of torsion.  The uterus is normal without fibroids. The endometrium is within normal  limits. There is a small amount of fluid that is lateral to the  cylindrical lesion. She does have pain with manipulation of probe toward  the rectum.   ASSESSMENT AND PLAN:  A 50 year old gravida 2, para 1, aborta 1, widowed  white female with possible hydrosalpinx and pelvic inflammatory disease.  Also possible diverticular abscess.  1. Intravenous antibiotics with mefoxin, doxycycline and metronidazole      will be initiated. Pain medicine will be continued.  2. She will be NPO for now.  3. Antibiotics will be continued for the next 24-48 hours and I will      assess for clinical improvement.  4. Surgical consult may be obtained if necessary.  5. CBC with differential will be obtained in the morning.  6. Additional orders will be written as needed.      Lum Keas, MD  Electronically Signed     MSM/MEDQ  D:  09/29/2008  T:  09/30/2008  Job:  [id number]

## 2010-09-02 NOTE — Op Note (Signed)
NAMETRINISHA, PAGET                ACCOUNT NO.:  [id number]   MEDICAL RECORD NO.:  [id number]          PATIENT TYPE:  INP   LOCATION:  1527                         FACILITY:  [facility]   PHYSICIAN:  M. Leda Quail, MD  DATE OF BIRTH:  14-Aug-1960   DATE OF PROCEDURE:  10/04/2008  DATE OF DISCHARGE:                               OPERATIVE REPORT   PREOPERATIVE DIAGNOSES:  1. A 50 year old gravida 1, para 1, divorced white female with left      tubo-ovarian abscess.  2. Continual spiking fevers despite almost 5 days of IV antibiotics.  3. The patient declined percutaneous drainage of abscess.  4. Anxiety.   POSTOPERATIVE DIAGNOSES:  27. A 50 year old gravida 1, para 1, divorced white female with left      tubo-ovarian abscess.  2. Continual spiking fevers despite almost 5 days of IV antibiotics.  3. The patient declined percutaneous drainage of abscess.  4. Anxiety.  5. Endometriosis of the cul-de-sac.   PROCEDURE:  Laparoscopic left salpingectomy with cauterization of  endometriosis in the cul-de-sac.   SURGEON:  M. Leda Quail, M.D.   ASSISTANT:  Edwena Felty. Romine, M.D.   ANESTHESIA:  General endotracheal.  Dr. Council Mechanic oversaw the case.   IV FLUIDS:  1000 mL of LR.   URINARY OUTPUT:  75 mL.   ESTIMATED BLOOD LOSS:  Minimal.   FINDINGS:  Left tubal ovarian abscess with the left tube adherent and  closed in the cul-de-sac secondary to the endometriosis.  Otherwise  normal-appearing pelvis with a normal right fallopian tube, right ovary  and left ovary. Normal-looking uterus.  Small amount of scarring at the  bladder flap.  Normal upper abdomen with normal gallbladder, liver and  stomach.   PATHOLOGY:  Left tube sent pathology.   CONDITION:  Stable.   INDICATIONS:  Mrs. Zareena Willis is a 50 year old G1, P57, divorced white  female who presented to the emergency room on Saturday with the acute  onset of with abdominal pain, diffuse throughout the abdomen with no  significant findings on the left side.  A CT was performed which showed  a left hydrosalpinx.  She did have free fluid in the pelvis.  She has  temperature and a white count of 14.6 and a left shift.  She did have a  very tender abdomen.  Cervical motion tenderness consistent with a tubo-  ovarian abscess.  Initially CT scan was a little bit confusing and  possibly a left ruptured diverticulum was also possible.  She was  started on antibiotics which covered with PID and TOA as well as left  diverticulum.  Although her white count did improve and her clinical  exam improved, she continued to spike temperatures.  We discussed  laparoscopy versus percutaneous drainage of this and initially decided  to proceed with  percutaneous drainage.  After being consented for this,  patient decided to change her mind.  Twenty-four hours later she  continued to still have fevers and at this point we discussed  laparoscopy.  Risks and benefits were explained to the patient.  These  are all documented  in the hospital chart.  Ultimately she consented to  proceed.   PROCEDURE:  The patient is taken to the operating room.  She is placed  in the supine position.  General endotracheal anesthesia was  administered by the anesthesia staff without difficulty.  Legs were  positioned in the low lithotomy position in [place] stirrups.  Right arm  was tucked and left arm was out at the 90-degree angle.  Abdomen,  perineum, inner thighs and vagina were prepped in normal sterile  fashion.  A bivalve speculum was used and  placed in the vagina.  The  anterior lip of the cervix was grasped with a single-tooth tenaculum.  Hulka clamp was passed through the endocervical canal and after the  uterus was sounded to 7 cm, the cervix was dilated to #19.  Kocher  clamps applied to the cervix as a means to manipulate the uterus during  the procedure.  The speculum was removed from vagina.  A Foley catheter  was inserted into the  bladder and placed on straight drain.  The patient  was then draped in normal sterile fashion.   Legs were in the low lithotomy position at this point.  Attention was  turned to the umbilicus  20.5% Marcaine was instilled beneath the  umbilicus.  A 10-mm skin incision was made with the #11 blade.  Subcutaneous fat tissue was dissected with a hemostat.  The fascia was  identified and grasped with Kocher clamps and elevated.  Using curved  Mayo scissors fascia was incised.  The peritoneum was identified.  It  was opened with hemostats.  Curved S retractor was placed inside.  The  operator's index finger was placed around inside the incision.  No  adhesions were noted.  The fascia was then stitched with a pursestring  suture of 2-0 Vicryl.  A Roseanne Reno was placed through the incision and held  in place with a suture.  The needle was cut.  Then using low flow CO2  gas a pneumoperitoneum was achieved.  A 10 mm laparoscope was used to  visualize the pelvis at this time.  A left enlarged tube consistent with  a tubal abscess was noted.  It was free on the sidewall, but it was  connected inferiorly to the cul-de-sac and scarred secondary to  endometriosis.  Right tube and ovary appeared normal.  The ureters were  noted bilaterally.  The upper abdomen was completely normal.  At this  point decision was made to place ports in the right and left lower  quadrant.  The abdominal wall was transilluminated to visualize  vasculature.  The 0.25% Marcaine was used to anesthetize the skin at the  port site locations and 5-mm skin incisions were made with a #11 blade  and to 5-mm trocar ports were placed directly under visualization of the  laparoscope.  These were non-bladed ports.  Then an atraumatic  endoscopic grasper and a blunt probe were used to manipulate the tube.  With this the tube was freed from the pelvis and endometriosis.  Two  spots endometriosis were noted in the cul-de-sac.  The EnSeal device  was  obtained.  The tube was then clamped, cauterized and incised at the  level of the cornua on the left side.  Then coming down the mesosalpinx  while elevating the tube, the tube was serially clamped, cauterized and  incised and peeling the tube from the left ovary.  The IP ligament was  completely left intact as was the  ovary.  Once the tube was freed, the  laparoscope from the midline was removed.  A 5 mm scope was used to  visualize the left side.  An endoscopic bag was placed abdominally and  the tube was placed in the bag.  The bag was brought back out to the  midline incision.  The bag was opened outside the patient and the tube  was brought up and cut, allowing significant amount of purulent material  to be free in the bag.  This was suctioned out with a Mizat suction  irrigator and tube was cut in several pieces until it was small so that  it came through the incision.  There was no spill in the abdomen.  All  of the bag and the contents as well as dirty instruments were handed  off.   Pneumoperitoneum was achieved again.  A 10 mm laparoscope was used to  visualize the pelvis again.  Using the Mizat suction irrigator with  lactated Ringer's, pelvis was irrigated multiple times.  There is  excellent hemostasis along the left side where cautery had been  performed.  An endoscopic scissors with monopolar cautery was obtained.  This was used to cauterize superficially places of endometriosis over  the posterior cul-de-sac.  At this point there was excellent hemostasis  and the surgery was complete.  The laparoscope was used to visualize the  port removal of the right and left lower quadrant ports.  The patient  was placed back in supine position from the  Trendelenburg positioning.  The laparoscope was removed and the pneumoperitoneum was released.  The  patient was given several good deep breaths by the C.R.N.A.  This was  done before removal of the [facility].  Once the Hasson was  removed, the  abdominal wall was elevated using pursestring sutures.  The operator's  index finger placed in the incision to ensure no bowel within this and  then the suture was tied tightly.  The suture was cut.  The incision at  the umbilicus was closed with subcuticular stitch of 3-0 Vicryl.  The  two inferior incisions were closed with Dermabond.  Dermabond was then  placed on the umbilicus incision.  The patient's legs were taken out of  the [place] stirrups after the Betadine prep was cleansed from her skin  and the Hulka clamp was removed from the vagina.  There was minimal  bleeding from the vagina and the patient was on her menstrual cycle so I  did not expect no bleeding at all.   Sponge, lap, and counts were correct x2.  The patient tolerated the  procedure well.  She was awake from anesthesia without difficulty.      Lum Keas, MD  Electronically Signed     MSM/MEDQ  D:  10/04/2008  T:  10/04/2008  Job:  [id number] [id number]

## 2010-09-05 NOTE — Discharge Summary (Signed)
Megan Bauer, Megan Bauer                ACCOUNT NO.:  [id number]   MEDICAL RECORD NO.:  [id number]          PATIENT TYPE:  INP   LOCATION:  1527                         [facility]ACILITY:  [facility]   PHYSICIAN:  M. Leda Quail, MD  DATE OF BIRTH:  [birth year]   DATE OF ADMISSION:  09/29/2008  DATE OF DISCHARGE:  10/05/2008                               DISCHARGE SUMMARY   ADMISSION DIAGNOSES:  1. Pelvic pain.  2. Pelvic abscess.  3. Facial skin cancer.  4. History of ovarian cyst.  5. Diverticulosis.  6. Anxiety   DISCHARGE DIAGNOSES:  1. Left pyosalpinx.  2. Pelvic pain.  3. Skin cancer.  4. History of ovarian cyst.  5. Diverticulosis.  6. Anxiety.   PROCEDURES:  Laparoscopy with left salpingectomy and cautery of  endometriosis.   CONSULTATIONS:  General Surgery consultation obtained by Wilmon Arms.  Tsuei, MD.   HISTORY OF PRESENT ILLNESS:  The dictated H and P is in the chart, but  in brief Ms. Buser is a 50 year old white female, who presented to the  hospital with 3-day history of lower abdominal pain.  She had a history  of previous ruptured ovarian cysts, which she felt was a similar pain.  However, the pain did not resolve but instead worsened.  There was  associated pain with bowel movements, low grade fever, and mild nausea.  Ultimately she came to the ER for evaluation.  Initially, her white  count was 13.6.  She was febrile with a fever of 100.1.  She had a CT  scan done in the ER that showed a cylindrical fluid density in the left  hemipelvis consistent with a hydrosalpinx.   PAST MEDICAL HISTORY:  Significant for:  1. Diverticulosis.  2. Facial skin cancer.  3. Ovarian cyst.   MEDICATIONS:  None.   ALLERGIES:  SENSITIVE TO NARCOTICS, but no true drug allergies.   HOSPITAL COURSE:  Initially, patient was admitted for IV antibiotics.  She was started on cefoxitin, Flagyl, and doxycycline.  A phone  consultation with general surgery was obtained and she had findings  of  diverticulosis on CT as well as the possible hydrosalpinx.  She did have  improvement in her white blood cell count as well as pain over the next  48 hours or so with antibiotic therapy.  However, her pain become  localized to the LLQ and she continued to have fever spikes.  The  patient had a significant amount of anxiety regarding doing anything  further other than IV antibiotics, but ultimately with continued fever  spikes, I recommended either a CT-guided drainage of this abscess versus  laparoscopy with removal of what appeared to be an abscess.  The patient  did consent for a repeat pelvic ultrasound, which was done on June 16th  and showed a pyosalpinx instead of a hydrosalpinx.  The patient  initially decided to proceed with percutaneous drainage via CT guidance,  but after undergoing her informed consent she changed her mind.  On  hospital day 5, patient consented to proceeding with laparoscopy and in  the late afternoon she was taken  to the operating room where a  laparoscopy was performed.  A left pyosalpinx was noted.  A left  salpingectomy was performed without difficulty and without any spillage  of the fluid inside the tube.  Endometriosis of the pelvis was noted and  the tube was scarred to the pelvis, which is most likely the cause of  the tube not being able to drain.  The ultimate source of infection I  could not clearly identify.  The patient did well during the surgery,  she had very minimal blood loss, she had excellent urine output.  After  an appropriate time in the recovery room, she was returned to her room.  She was monitored overnight after the laparoscopy and her fever  completely resolved.  The morning of postoperative day 1, she was  afebrile with stable vital signs.  She was ambulating, voiding,  tolerating a regular diet, and had excellent pain control.  She had  repeat blood work that showed a white count of 6.6, hemoglobin of 10.6,  and a platelet  count of 184.  At this point, she was felt to be safe for  discharge home.  She was discharged home with her family.  Instructions  were provided in the written and verbal form.  She was also informed to  call if she had a temperature greater than 100.5, worsening pain or  nausea, or if she had any redness or drainage from her incisions.  She  will either be seen in my office in 2 weeks or back in [place] for  followup as she was visiting from [place].  She was given prescriptions  for Percocet 5/325, one to two tablets p.o. q.4 to 6 hours p.r.n. pain,  over-the-counter Motrin as needed, and she will continue on doxycycline  100 mg b.i.d. for 7 days as well as Flagyl 500 mg b.i.d. for 7 days.      Lum Keas, MD  Electronically Signed     MSM/MEDQ  D:  10/25/2008  T:  10/25/2008  Job:  [id number]

## 2012-11-30 ENCOUNTER — Encounter: Payer: Self-pay | Admitting: Gastroenterology

## 2013-08-10 ENCOUNTER — Encounter: Payer: Self-pay | Admitting: Gastroenterology

## 2015-02-07 ENCOUNTER — Emergency Department (INDEPENDENT_AMBULATORY_CARE_PROVIDER_SITE_OTHER)
Admission: EM | Admit: 2015-02-07 | Discharge: 2015-02-07 | Disposition: A | Discharge status: Home / Self Care | Payer: Self-pay | Source: Home / Self Care | Attending: Family Medicine | Admitting: Family Medicine

## 2015-02-07 ENCOUNTER — Encounter (HOSPITAL_COMMUNITY): Payer: Self-pay | Admitting: Emergency Medicine

## 2015-02-07 DIAGNOSIS — G44319 Acute post-traumatic headache, not intractable: Secondary | ICD-10-CM

## 2015-02-07 DIAGNOSIS — M542 Cervicalgia: Secondary | ICD-10-CM

## 2015-02-07 HISTORY — DX: Sprain of ligaments of cervical spine, initial encounter: S13.4XXA
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
